# Patient Record
Sex: Female | Born: 1955
Health system: Southern US, Community
[De-identification: ages and names within clinical notes are randomized; demographics above are authoritative.]

## PROBLEM LIST (undated history)

## (undated) DIAGNOSIS — E039 Hypothyroidism, unspecified: Secondary | ICD-10-CM

## (undated) DIAGNOSIS — M791 Myalgia, unspecified site: Secondary | ICD-10-CM

## (undated) HISTORY — PX: ABDOMINAL HYSTERECTOMY: SHX81

## (undated) HISTORY — DX: Hypothyroidism, unspecified: E03.9

## (undated) HISTORY — DX: Myalgia, unspecified site: M79.10

---

## 2017-03-16 DIAGNOSIS — D2262 Melanocytic nevi of left upper limb, including shoulder: Secondary | ICD-10-CM | POA: Diagnosis not present

## 2017-03-16 DIAGNOSIS — D2272 Melanocytic nevi of left lower limb, including hip: Secondary | ICD-10-CM | POA: Diagnosis not present

## 2017-03-16 DIAGNOSIS — D2271 Melanocytic nevi of right lower limb, including hip: Secondary | ICD-10-CM | POA: Diagnosis not present

## 2017-03-16 DIAGNOSIS — L538 Other specified erythematous conditions: Secondary | ICD-10-CM | POA: Diagnosis not present

## 2017-03-16 DIAGNOSIS — L820 Inflamed seborrheic keratosis: Secondary | ICD-10-CM | POA: Diagnosis not present

## 2017-03-16 DIAGNOSIS — D485 Neoplasm of uncertain behavior of skin: Secondary | ICD-10-CM | POA: Diagnosis not present

## 2017-03-16 DIAGNOSIS — D2261 Melanocytic nevi of right upper limb, including shoulder: Secondary | ICD-10-CM | POA: Diagnosis not present

## 2017-05-27 DIAGNOSIS — R5381 Other malaise: Secondary | ICD-10-CM | POA: Diagnosis not present

## 2017-05-27 DIAGNOSIS — N951 Menopausal and female climacteric states: Secondary | ICD-10-CM | POA: Diagnosis not present

## 2017-05-27 DIAGNOSIS — E039 Hypothyroidism, unspecified: Secondary | ICD-10-CM | POA: Diagnosis not present

## 2017-05-27 DIAGNOSIS — G8929 Other chronic pain: Secondary | ICD-10-CM | POA: Diagnosis not present

## 2017-05-27 DIAGNOSIS — D729 Disorder of white blood cells, unspecified: Secondary | ICD-10-CM | POA: Diagnosis not present

## 2017-06-02 DIAGNOSIS — D729 Disorder of white blood cells, unspecified: Secondary | ICD-10-CM | POA: Diagnosis not present

## 2017-06-02 DIAGNOSIS — R5381 Other malaise: Secondary | ICD-10-CM | POA: Diagnosis not present

## 2017-06-02 DIAGNOSIS — E039 Hypothyroidism, unspecified: Secondary | ICD-10-CM | POA: Diagnosis not present

## 2017-06-02 DIAGNOSIS — R5383 Other fatigue: Secondary | ICD-10-CM | POA: Diagnosis not present

## 2017-07-07 DIAGNOSIS — R5383 Other fatigue: Secondary | ICD-10-CM | POA: Diagnosis not present

## 2017-07-07 DIAGNOSIS — G8929 Other chronic pain: Secondary | ICD-10-CM | POA: Diagnosis not present

## 2017-07-07 DIAGNOSIS — D729 Disorder of white blood cells, unspecified: Secondary | ICD-10-CM | POA: Diagnosis not present

## 2017-07-07 DIAGNOSIS — R5381 Other malaise: Secondary | ICD-10-CM | POA: Diagnosis not present

## 2017-07-23 DIAGNOSIS — Z1231 Encounter for screening mammogram for malignant neoplasm of breast: Secondary | ICD-10-CM | POA: Diagnosis not present

## 2017-10-20 DIAGNOSIS — E039 Hypothyroidism, unspecified: Secondary | ICD-10-CM | POA: Diagnosis not present

## 2017-10-20 DIAGNOSIS — R5381 Other malaise: Secondary | ICD-10-CM | POA: Diagnosis not present

## 2017-10-20 DIAGNOSIS — D729 Disorder of white blood cells, unspecified: Secondary | ICD-10-CM | POA: Diagnosis not present

## 2017-10-20 DIAGNOSIS — N951 Menopausal and female climacteric states: Secondary | ICD-10-CM | POA: Diagnosis not present

## 2017-11-03 DIAGNOSIS — R5381 Other malaise: Secondary | ICD-10-CM | POA: Diagnosis not present

## 2017-11-03 DIAGNOSIS — D729 Disorder of white blood cells, unspecified: Secondary | ICD-10-CM | POA: Diagnosis not present

## 2017-11-03 DIAGNOSIS — G8929 Other chronic pain: Secondary | ICD-10-CM | POA: Diagnosis not present

## 2017-11-03 DIAGNOSIS — R5383 Other fatigue: Secondary | ICD-10-CM | POA: Diagnosis not present

## 2017-11-08 DIAGNOSIS — H5203 Hypermetropia, bilateral: Secondary | ICD-10-CM | POA: Diagnosis not present

## 2017-11-11 ENCOUNTER — Ambulatory Visit (INDEPENDENT_AMBULATORY_CARE_PROVIDER_SITE_OTHER): Payer: BLUE CROSS/BLUE SHIELD | Admitting: Sports Medicine

## 2017-11-11 ENCOUNTER — Encounter: Payer: Self-pay | Admitting: Sports Medicine

## 2017-11-11 ENCOUNTER — Ambulatory Visit (INDEPENDENT_AMBULATORY_CARE_PROVIDER_SITE_OTHER): Payer: BLUE CROSS/BLUE SHIELD

## 2017-11-11 DIAGNOSIS — M170 Bilateral primary osteoarthritis of knee: Secondary | ICD-10-CM | POA: Insufficient documentation

## 2017-11-11 DIAGNOSIS — M76821 Posterior tibial tendinitis, right leg: Secondary | ICD-10-CM | POA: Diagnosis not present

## 2017-11-11 DIAGNOSIS — M1712 Unilateral primary osteoarthritis, left knee: Secondary | ICD-10-CM | POA: Diagnosis not present

## 2017-11-11 DIAGNOSIS — M1711 Unilateral primary osteoarthritis, right knee: Secondary | ICD-10-CM

## 2017-11-11 NOTE — Assessment & Plan Note (Addendum)
Failed conservative measures, injection as above. Rehab exercises given, baseline x-rays. Considering her severe valgus deformity and bone-on-bone lateral compartment osteoarthritis I do not think she is far from knee arthroplasty. Return in 1 month.

## 2017-11-11 NOTE — Assessment & Plan Note (Signed)
Rehab exercises given, return for custom orthotics.

## 2017-11-11 NOTE — Progress Notes (Signed)
Subjective:    I'm seeing this patient as a consultation for: Dr. Angelena Sole  CC: Right knee pain  HPI: Nicole Zhang is a pleasant 62 year old female early undergoing treatment for chronic Lyme disease.  For the past several months she had worsening pain in her right knee, as well as a progression of a valgus deformity.  Pain is moderate, persistent, localized at the lateral joint line, significant gelling, no mechanical symptoms.  She has tried oral NSAIDs, analgesics without any improvement in pain.  In addition she has been complaining of pain behind her medial malleolus of her right ankle.  Worse with weightbearing, moderate, persistent without radiation.  I reviewed the past medical history, family history, social history, surgical history, and allergies today and no changes were needed.  Please see the problem list section below in epic for further details.  Past Medical History: Past Medical History:  Diagnosis Date  . Hypothyroid   . Muscle pain    Past Surgical History: Past Surgical History:  Procedure Laterality Date  . ABDOMINAL HYSTERECTOMY     Social History: Social History   Socioeconomic History  . Marital status: Married    Spouse name: Not on file  . Number of children: Not on file  . Years of education: Not on file  . Highest education level: Not on file  Occupational History  . Not on file  Social Needs  . Financial resource strain: Not on file  . Food insecurity:    Worry: Not on file    Inability: Not on file  . Transportation needs:    Medical: Not on file    Non-medical: Not on file  Tobacco Use  . Smoking status: Never Smoker  . Smokeless tobacco: Never Used  Substance and Sexual Activity  . Alcohol use: Never    Frequency: Never  . Drug use: Never  . Sexual activity: Yes  Lifestyle  . Physical activity:    Days per week: Not on file    Minutes per session: Not on file  . Stress: Not on file  Relationships  . Social connections:    Talks on phone: Not on file    Gets together: Not on file    Attends religious service: Not on file    Active member of club or organization: Not on file    Attends meetings of clubs or organizations: Not on file    Relationship status: Not on file  Other Topics Concern  . Not on file  Social History Narrative  . Not on file   Family History: Family History  Problem Relation Age of Onset  . Hypertension Mother   . Diabetes Mother   . Cancer Mother   . Hypertension Father   . Cancer Father    Allergies: Allergies  Allergen Reactions  . Codeine Nausea And Vomiting  . Penicillins Rash   Medications: See med rec.  Review of Systems: No headache, visual changes, nausea, vomiting, diarrhea, constipation, dizziness, abdominal pain, skin rash, fevers, chills, night sweats, weight loss, swollen lymph nodes, body aches, joint swelling, muscle aches, chest pain, shortness of breath, mood changes, visual or auditory hallucinations.   Objective:   General: Well Developed, well nourished, and in no acute distress.  Neuro:  Extra-ocular muscles intact, able to move all 4 extremities, sensation grossly intact.  Deep tendon reflexes tested were normal. Psych: Alert and oriented, mood congruent with affect. ENT:  Ears and nose appear unremarkable.  Hearing grossly normal. Neck: Unremarkable overall appearance,  trachea midline.  No visible thyroid enlargement. Eyes: Conjunctivae and lids appear unremarkable.  Pupils equal and round. Skin: Warm and dry, no rashes noted.  Cardiovascular: Pulses palpable, no extremity edema. Right knee: Valgus deformity, tenderness at the lateral joint line ROM normal in flexion and extension and lower leg rotation. Ligaments with solid consistent endpoints including ACL, PCL, LCL, MCL. Negative Mcmurray's and provocative meniscal tests. Non painful patellar compression. Patellar and quadriceps tendons unremarkable. Hamstring and quadriceps strength is  normal. Right ankle: No visible erythema or swelling. Range of motion is full in all directions. Strength is 5/5 in all directions. Stable lateral and medial ligaments; squeeze test and kleiger test unremarkable; Talar dome nontender; No pain at base of 5th MT; No tenderness over cuboid; No tenderness over N spot or navicular prominence Tender with fullness behind the medial malleolus, reproduction of pain with resisted inversion of the ankle and foot No sign of peroneal tendon subluxations; Negative tarsal tunnel tinel's Able to walk 4 steps.  Procedure: Real-time Ultrasound Guided Injection of right knee joint Device: GE Logiq E  Verbal informed consent obtained.  Time-out conducted.  Noted no overlying erythema, induration, or other signs of local infection.  Skin prepped in a sterile fashion.  Local anesthesia: Topical Ethyl chloride.  With sterile technique and under real time ultrasound guidance: 1 cc kenalog 40, 2 cc lidocaine, 2 cc bupivacaine injected easily. Completed without difficulty  Pain immediately resolved suggesting accurate placement of the medication.  Advised to call if fevers/chills, erythema, induration, drainage, or persistent bleeding.  Images permanently stored and available for review in the ultrasound unit.  Impression: Technically successful ultrasound guided injection.  X-rays personally reviewed and show severe end-stage lateral compartment osteoarthritis in both knees, right worse than left with significant valgus deformity.  Impression and Recommendations:   This case required medical decision making of moderate complexity.  Tibialis posterior tendinitis, right Rehab exercises given, return for custom orthotics.  Primary osteoarthritis of right knee Failed conservative measures, injection as above. Rehab exercises given, baseline x-rays. Considering her severe valgus deformity and bone-on-bone lateral compartment osteoarthritis I do not think  she is far from knee arthroplasty. Return in 1 month. ___________________________________________ Ihor Austin. Benjamin Stain, M.D., ABFM., CAQSM. Primary Care and Sports Medicine [facility]  Adjunct Instructor of Family Medicine  [employer]

## 2017-11-22 ENCOUNTER — Encounter: Payer: Self-pay | Admitting: Sports Medicine

## 2017-11-22 ENCOUNTER — Ambulatory Visit (INDEPENDENT_AMBULATORY_CARE_PROVIDER_SITE_OTHER): Payer: BLUE CROSS/BLUE SHIELD | Admitting: Sports Medicine

## 2017-11-22 DIAGNOSIS — M76821 Posterior tibial tendinitis, right leg: Secondary | ICD-10-CM | POA: Diagnosis not present

## 2017-11-22 DIAGNOSIS — M1711 Unilateral primary osteoarthritis, right knee: Secondary | ICD-10-CM

## 2017-11-22 NOTE — Assessment & Plan Note (Signed)
Custom orthotics as above, continue rehab exercises. If insufficient improvement over the next 4 to 6 weeks we will consider the addition of an [place] brace.

## 2017-11-22 NOTE — Progress Notes (Signed)
    Patient was fitted for a : standard, cushioned, semi-rigid orthotic. The orthotic was heated and afterward the patient stood on the orthotic blank positioned on the orthotic stand. The patient was positioned in subtalar neutral position and 10 degrees of ankle dorsiflexion in a weight bearing stance. After completion of molding, a stable base was applied to the orthotic blank. The blank was ground to a stable position for weight bearing. Size: 9 Base: White EVA Additional Posting and Padding: None The patient ambulated these, and they were very comfortable.  I spent 40 minutes with this patient, greater than 50% was face-to-face time counseling regarding the below diagnosis.  ___________________________________________ Alyviah Crandle J. Cincere Zorn, M.D., ABFM., CAQSM. Primary Care and Sports Medicine [facility]  Adjunct Instructor of Family Medicine  [employer]   

## 2017-11-22 NOTE — Assessment & Plan Note (Signed)
Right knee injection at the last visit provided good relief in pain. There is severe valgus deformity with bone-on-bone lateral compartment osteoarthritis. We could certainly try Visco supplementation if fails to get a 71-month response to the injection.

## 2017-12-20 ENCOUNTER — Encounter: Payer: Self-pay | Admitting: Sports Medicine

## 2017-12-20 ENCOUNTER — Ambulatory Visit: Payer: BLUE CROSS/BLUE SHIELD | Admitting: Sports Medicine

## 2017-12-20 DIAGNOSIS — M76821 Posterior tibial tendinitis, right leg: Secondary | ICD-10-CM | POA: Diagnosis not present

## 2017-12-20 DIAGNOSIS — M170 Bilateral primary osteoarthritis of knee: Secondary | ICD-10-CM

## 2017-12-20 NOTE — Progress Notes (Signed)
Subjective:    CC: Knee pain  HPI: Bilateral knee osteoarthritis: With valgus deformity, fantastic response to right knee injection, 90% pain relief.  She desires a similar procedure on the left today, pain is moderate, persistent, localized to the joint line with gelling.  Tibialis posterior tendinitis: Resolved with custom orthotics and rehab exercises.  I reviewed the past medical history, family history, social history, surgical history, and allergies today and no changes were needed.  Please see the problem list section below in epic for further details.  Past Medical History: Past Medical History:  Diagnosis Date  . Hypothyroid   . Muscle pain    Past Surgical History: Past Surgical History:  Procedure Laterality Date  . ABDOMINAL HYSTERECTOMY     Social History: Social History   Socioeconomic History  . Marital status: Married    Spouse name: Not on file  . Number of children: Not on file  . Years of education: Not on file  . Highest education level: Not on file  Occupational History  . Not on file  Social Needs  . Financial resource strain: Not on file  . Food insecurity:    Worry: Not on file    Inability: Not on file  . Transportation needs:    Medical: Not on file    Non-medical: Not on file  Tobacco Use  . Smoking status: Never Smoker  . Smokeless tobacco: Never Used  Substance and Sexual Activity  . Alcohol use: Never    Frequency: Never  . Drug use: Never  . Sexual activity: Yes  Lifestyle  . Physical activity:    Days per week: Not on file    Minutes per session: Not on file  . Stress: Not on file  Relationships  . Social connections:    Talks on phone: Not on file    Gets together: Not on file    Attends religious service: Not on file    Active member of club or organization: Not on file    Attends meetings of clubs or organizations: Not on file    Relationship status: Not on file  Other Topics Concern  . Not on file  Social History  Narrative  . Not on file   Family History: Family History  Problem Relation Age of Onset  . Hypertension Mother   . Diabetes Mother   . Cancer Mother   . Hypertension Father   . Cancer Father    Allergies: Allergies  Allergen Reactions  . Codeine Nausea And Vomiting  . Penicillins Rash   Medications: See med rec.  Review of Systems: No fevers, chills, night sweats, weight loss, chest pain, or shortness of breath.   Objective:    General: Well Developed, well nourished, and in no acute distress.  Neuro: Alert and oriented x3, extra-ocular muscles intact, sensation grossly intact.  HEENT: Normocephalic, atraumatic, pupils equal round reactive to light, neck supple, no masses, no lymphadenopathy, thyroid nonpalpable.  Skin: Warm and dry, no rashes. Cardiac: Regular rate and rhythm, no murmurs rubs or gallops, no lower extremity edema.  Respiratory: Clear to auscultation bilaterally. Not using accessory muscles, speaking in full sentences. Left knee: Valgus deformity with tenderness at the medial and lateral joint lines. ROM normal in flexion and extension and lower leg rotation. Ligaments with solid consistent endpoints including ACL, PCL, LCL, MCL. Negative Mcmurray's and provocative meniscal tests. Non painful patellar compression. Patellar and quadriceps tendons unremarkable. Hamstring and quadriceps strength is normal.  Procedure: Real-time Ultrasound Guided Injection  of left knee Device: GE Logiq E  Verbal informed consent obtained.  Time-out conducted.  Noted no overlying erythema, induration, or other signs of local infection.  Skin prepped in a sterile fashion.  Local anesthesia: Topical Ethyl chloride.  With sterile technique and under real time ultrasound guidance: 1 cc kenalog 40, 2 cc lidocaine, 2 cc bupivacaine injected easily Completed without difficulty  Pain immediately resolved suggesting accurate placement of the medication.  Advised to call if  fevers/chills, erythema, induration, drainage, or persistent bleeding.  Images permanently stored and available for review in the ultrasound unit.  Impression: Technically successful ultrasound guided injection.  Impression and Recommendations:    Primary osteoarthritis of both knees Excellent relief with right knee injection a month ago. Patient desires left knee to be injected today. This was done under ultrasound guidance, she does have severe valgus deformity with bone-on-bone lateral compartment osteoarthritis.   Tibialis posterior tendinitis, right Resolved with rehabilitation exercises and custom orthotics. We could certainly add an [place] brace for her tibialis posterior tendinitis at a future visit if insufficient improvement. ___________________________________________ Ihor Austin. Benjamin Stain, M.D., ABFM., CAQSM. Primary Care and Sports Medicine [place]  Adjunct Professor of Family Medicine  [employer]

## 2017-12-20 NOTE — Assessment & Plan Note (Signed)
Excellent relief with right knee injection a month ago. Patient desires left knee to be injected today. This was done under ultrasound guidance, she does have severe valgus deformity with bone-on-bone lateral compartment osteoarthritis.

## 2017-12-20 NOTE — Assessment & Plan Note (Signed)
Resolved with rehabilitation exercises and custom orthotics. We could certainly add an [place] brace for her tibialis posterior tendinitis at a future visit if insufficient improvement.

## 2018-02-23 ENCOUNTER — Ambulatory Visit: Payer: BLUE CROSS/BLUE SHIELD | Admitting: Sports Medicine

## 2018-02-23 ENCOUNTER — Encounter: Payer: Self-pay | Admitting: Sports Medicine

## 2018-02-23 DIAGNOSIS — M170 Bilateral primary osteoarthritis of knee: Secondary | ICD-10-CM | POA: Diagnosis not present

## 2018-02-23 NOTE — Progress Notes (Signed)
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  She is now having recurrence of pain, moderate, persistent, localized to the lateral joint line without radiation, no trauma, no mechanical symptoms.  I reviewed the past medical history, family history, social history, surgical history, and allergies today and no changes were needed.  Please see the problem list section below in epic for further details.  Past Medical History: Past Medical History:  Diagnosis Date  . Hypothyroid   . Muscle pain    Past Surgical History: Past Surgical History:  Procedure Laterality Date  . ABDOMINAL HYSTERECTOMY     Social History: Social History   Socioeconomic History  . Marital status: Married    Spouse name: Not on file  . Number of children: Not on file  . Years of education: Not on file  . Highest education level: Not on file  Occupational History  . Not on file  Social Needs  . Financial resource strain: Not on file  . Food insecurity:    Worry: Not on file    Inability: Not on file  . Transportation needs:    Medical: Not on file    Non-medical: Not on file  Tobacco Use  . Smoking status: Never Smoker  . Smokeless tobacco: Never Used  Substance and Sexual Activity  . Alcohol use: Never    Frequency: Never  . Drug use: Never  . Sexual activity: Yes  Lifestyle  . Physical activity:    Days per week: Not on file    Minutes per session: Not on file  . Stress: Not on file  Relationships  . Social connections:    Talks on phone: Not on file    Gets together: Not on file    Attends religious service: Not on file    Active member of club or organization: Not on file    Attends meetings of clubs or organizations: Not on file    Relationship status: Not on file  Other Topics Concern  . Not on file  Social History Narrative  . Not on file    Family History: Family History  Problem Relation Age of Onset  . Hypertension Mother   . Diabetes Mother   . Cancer Mother   . Hypertension Father   . Cancer Father    Allergies: Allergies  Allergen Reactions  . Codeine Nausea And Vomiting  . Penicillins Rash   Medications: See med rec.  Review of Systems: No fevers, chills, night sweats, weight loss, chest pain, or shortness of breath.   Objective:    General: Well Developed, well nourished, and in no acute distress.  Neuro: Alert and oriented x3, extra-ocular muscles intact, sensation grossly intact.  HEENT: Normocephalic, atraumatic, pupils equal round reactive to light, neck supple, no masses, no lymphadenopathy, thyroid nonpalpable.  Skin: Warm and dry, no rashes. Cardiac: Regular rate and rhythm, no murmurs rubs or gallops, no lower extremity edema.  Respiratory: Clear to auscultation bilaterally. Not using accessory muscles, speaking in full sentences. Right knee: Normal to inspection with no erythema or effusion or obvious bony abnormalities. Valgus deformity, tender to palpation at the lateral joint line ROM normal in flexion and extension and lower leg rotation. Ligaments with solid consistent endpoints including ACL, PCL, LCL, MCL. Negative Mcmurray's and provocative meniscal tests. Non painful patellar compression. Patellar and quadriceps tendons unremarkable. Hamstring and  quadriceps strength is normal.  Procedure: Real-time Ultrasound Guided Injection of right knee Device: GE Logiq E  Verbal informed consent obtained.  Time-out conducted.  Noted no overlying erythema, induration, or other signs of local infection.  Skin prepped in a sterile fashion.  Local anesthesia: Topical Ethyl chloride.  With sterile technique and under real time ultrasound guidance: 1 cc Kenalog 40, 2 cc lidocaine, 2 cc bupivacaine injected easily Completed without difficulty  Pain immediately resolved suggesting accurate  placement of the medication.  Advised to call if fevers/chills, erythema, induration, drainage, or persistent bleeding.  Images permanently stored and available for review in the ultrasound unit.  Impression: Technically successful ultrasound guided injection.  Impression and Recommendations:    Primary osteoarthritis of both knees Good response to right knee injection about 3 to 4 months ago. Repeat today. She does have severe valgus deformity with bone-on-bone lateral compartment osteoarthritis and I do think she is approaching arthroplasty. We are going to add an OA reaction brace for unloading of the right lateral compartment. ___________________________________________ Ihor Austin. Benjamin Stain, M.D., ABFM., CAQSM. Primary Care and Sports Medicine [facility]  Adjunct Professor of Family Medicine  [employer]

## 2018-02-23 NOTE — Assessment & Plan Note (Signed)
Good response to right knee injection about 3 to 4 months ago. Repeat today. She does have severe valgus deformity with bone-on-bone lateral compartment osteoarthritis and I do think she is approaching arthroplasty. We are going to add an OA reaction brace for unloading of the right lateral compartment.

## 2018-04-13 DIAGNOSIS — G8929 Other chronic pain: Secondary | ICD-10-CM | POA: Diagnosis not present

## 2018-04-13 DIAGNOSIS — D729 Disorder of white blood cells, unspecified: Secondary | ICD-10-CM | POA: Diagnosis not present

## 2018-04-13 DIAGNOSIS — E039 Hypothyroidism, unspecified: Secondary | ICD-10-CM | POA: Diagnosis not present

## 2018-04-13 DIAGNOSIS — N951 Menopausal and female climacteric states: Secondary | ICD-10-CM | POA: Diagnosis not present

## 2018-04-13 DIAGNOSIS — R5381 Other malaise: Secondary | ICD-10-CM | POA: Diagnosis not present

## 2018-04-27 DIAGNOSIS — E039 Hypothyroidism, unspecified: Secondary | ICD-10-CM | POA: Diagnosis not present

## 2018-04-27 DIAGNOSIS — R5383 Other fatigue: Secondary | ICD-10-CM | POA: Diagnosis not present

## 2018-04-27 DIAGNOSIS — G8929 Other chronic pain: Secondary | ICD-10-CM | POA: Diagnosis not present

## 2018-04-27 DIAGNOSIS — R5381 Other malaise: Secondary | ICD-10-CM | POA: Diagnosis not present

## 2018-06-07 ENCOUNTER — Ambulatory Visit (INDEPENDENT_AMBULATORY_CARE_PROVIDER_SITE_OTHER): Payer: BLUE CROSS/BLUE SHIELD | Admitting: Sports Medicine

## 2018-06-07 ENCOUNTER — Encounter: Payer: Self-pay | Admitting: Sports Medicine

## 2018-06-07 DIAGNOSIS — M170 Bilateral primary osteoarthritis of knee: Secondary | ICD-10-CM | POA: Diagnosis not present

## 2018-06-07 MED ORDER — CELECOXIB 200 MG PO CAPS: ORAL_CAPSULE | ORAL | 2 refills | Status: DC

## 2018-06-07 NOTE — Progress Notes (Signed)
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  Oral Voltaren is insufficiently efficacious at this point.  I reviewed the past medical history, family history, social history, surgical history, and allergies today and no changes were needed.  Please see the problem list section below in epic for further details.  Past Medical History: Past Medical History:  Diagnosis Date  . Hypothyroid   . Muscle pain    Past Surgical History: Past Surgical History:  Procedure Laterality Date  . ABDOMINAL HYSTERECTOMY     Social History: Social History   Socioeconomic History  . Marital status: Married    Spouse name: Not on file  . Number of children: Not on file  . Years of education: Not on file  . Highest education level: Not on file  Occupational History  . Not on file  Social Needs  . Financial resource strain: Not on file  . Food insecurity:    Worry: Not on file    Inability: Not on file  . Transportation needs:    Medical: Not on file    Non-medical: Not on file  Tobacco Use  . Smoking status: Never Smoker  . Smokeless tobacco: Never Used  Substance and Sexual Activity  . Alcohol use: Never    Frequency: Never  . Drug use: Never  . Sexual activity: Yes  Lifestyle  . Physical activity:    Days per week: Not on file    Minutes per session: Not on file  . Stress: Not on file  Relationships  . Social connections:    Talks on phone: Not on file    Gets together: Not on file    Attends religious service: Not on file    Active member of club or organization: Not on file    Attends meetings of clubs or organizations: Not on file    Relationship status: Not on file  Other Topics  Concern  . Not on file  Social History Narrative  . Not on file   Family History: Family History  Problem Relation Age of Onset  . Hypertension Mother   . Diabetes Mother   . Cancer Mother   . Hypertension Father   . Cancer Father    Allergies: Allergies  Allergen Reactions  . Codeine Nausea And Vomiting  . Penicillins Rash   Medications: See med rec.  Review of Systems: No fevers, chills, night sweats, weight loss, chest pain, or shortness of breath.   Objective:    General: Well Developed, well nourished, and in no acute distress.  Neuro: Alert and oriented x3, extra-ocular muscles intact, sensation grossly intact.  HEENT: Normocephalic, atraumatic, pupils equal round reactive to light, neck supple, no masses, no lymphadenopathy, thyroid nonpalpable.  Skin: Warm and dry, no rashes. Cardiac: Regular rate and rhythm, no murmurs rubs or gallops, no lower extremity edema.  Respiratory: Clear to auscultation bilaterally. Not using accessory muscles, speaking in full sentences. Bilateral knees: Mild swelling, valgus deformity, tender at the lateral joint lines. ROM normal in flexion and extension and lower leg rotation. Ligaments with solid consistent endpoints including ACL, PCL, LCL, MCL. Negative Mcmurray's and provocative meniscal tests. Non painful patellar compression. Patellar and quadriceps tendons unremarkable. Hamstring and quadriceps strength is normal.  Procedure: Real-time Ultrasound Guided injection of the left knee Device: GE Logiq E  Verbal informed consent obtained.  Time-out conducted.  Noted no overlying erythema, induration, or other signs of local infection.  Skin prepped in a sterile fashion.  Local anesthesia: Topical Ethyl chloride.  With sterile technique and under real time ultrasound guidance:  1 cc Kenalog 40, 2 cc lidocaine, 2 cc bupivacaine injected easily Completed without difficulty  Pain immediately resolved suggesting accurate placement  of the medication.  Advised to call if fevers/chills, erythema, induration, drainage, or persistent bleeding.  Images permanently stored and available for review in the ultrasound unit.  Impression: Technically successful ultrasound guided injection.  Procedure: Real-time Ultrasound Guided injection of the right knee Device: GE Logiq E  Verbal informed consent obtained.  Time-out conducted.  Noted no overlying erythema, induration, or other signs of local infection.  Skin prepped in a sterile fashion.  Local anesthesia: Topical Ethyl chloride.  With sterile technique and under real time ultrasound guidance:  1 cc Kenalog 40, 2 cc lidocaine, 2 cc bupivacaine injected easily Completed without difficulty  Pain immediately resolved suggesting accurate placement of the medication.  Advised to call if fevers/chills, erythema, induration, drainage, or persistent bleeding.  Images permanently stored and available for review in the ultrasound unit.  Impression: Technically successful ultrasound guided injection.  Impression and Recommendations:    Primary osteoarthritis of both knees Previous injection was on the right in January of this year. Repeat injections, this time bilaterally. She does have a severe valgus deformity with bone-on-bone lateral compartment osteoarthritis, approaching arthroplasty. Switching from Voltaren to Celebrex. Return as needed.   ___________________________________________ Ihor Austin. Benjamin Stain, M.D., ABFM., CAQSM. Primary Care and Sports Medicine [place] [facility]  Adjunct Professor of Family Medicine  [employer]

## 2018-06-07 NOTE — Assessment & Plan Note (Signed)
Previous injection was on the right in January of this year. Repeat injections, this time bilaterally. She does have a severe valgus deformity with bone-on-bone lateral compartment osteoarthritis, approaching arthroplasty. Switching from Voltaren to Celebrex. Return as needed.

## 2018-07-05 DIAGNOSIS — D225 Melanocytic nevi of trunk: Secondary | ICD-10-CM | POA: Diagnosis not present

## 2018-07-05 DIAGNOSIS — L648 Other androgenic alopecia: Secondary | ICD-10-CM | POA: Diagnosis not present

## 2018-07-05 DIAGNOSIS — L821 Other seborrheic keratosis: Secondary | ICD-10-CM | POA: Diagnosis not present

## 2018-07-05 DIAGNOSIS — D2271 Melanocytic nevi of right lower limb, including hip: Secondary | ICD-10-CM | POA: Diagnosis not present

## 2018-07-19 DIAGNOSIS — N951 Menopausal and female climacteric states: Secondary | ICD-10-CM | POA: Diagnosis not present

## 2018-07-19 DIAGNOSIS — E039 Hypothyroidism, unspecified: Secondary | ICD-10-CM | POA: Diagnosis not present

## 2018-08-02 DIAGNOSIS — G8929 Other chronic pain: Secondary | ICD-10-CM | POA: Diagnosis not present

## 2018-08-02 DIAGNOSIS — R5381 Other malaise: Secondary | ICD-10-CM | POA: Diagnosis not present

## 2018-08-02 DIAGNOSIS — E669 Obesity, unspecified: Secondary | ICD-10-CM | POA: Diagnosis not present

## 2018-08-02 DIAGNOSIS — E039 Hypothyroidism, unspecified: Secondary | ICD-10-CM | POA: Diagnosis not present

## 2018-08-02 DIAGNOSIS — R05 Cough: Secondary | ICD-10-CM | POA: Diagnosis not present

## 2018-08-02 DIAGNOSIS — E009 Congenital iodine-deficiency syndrome, unspecified: Secondary | ICD-10-CM | POA: Diagnosis not present

## 2018-08-02 DIAGNOSIS — R5383 Other fatigue: Secondary | ICD-10-CM | POA: Diagnosis not present

## 2018-09-05 ENCOUNTER — Other Ambulatory Visit: Payer: Self-pay | Admitting: Sports Medicine

## 2018-09-05 DIAGNOSIS — M170 Bilateral primary osteoarthritis of knee: Secondary | ICD-10-CM

## 2018-09-22 ENCOUNTER — Other Ambulatory Visit: Payer: Self-pay

## 2018-09-22 ENCOUNTER — Ambulatory Visit: Payer: BC Managed Care – PPO | Admitting: Sports Medicine

## 2018-09-22 ENCOUNTER — Encounter: Payer: Self-pay | Admitting: Sports Medicine

## 2018-09-22 DIAGNOSIS — M170 Bilateral primary osteoarthritis of knee: Secondary | ICD-10-CM

## 2018-09-22 NOTE — Assessment & Plan Note (Signed)
Right knee injection, she is heading to [facility]. Return as needed.

## 2018-09-22 NOTE — Progress Notes (Signed)
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  She is going to the beach and desires repeat interventional treatment today.  I reviewed the past medical history, family history, social history, surgical history, and allergies today and no changes were needed.  Please see the problem list section below in epic for further details.  Past Medical History: Past Medical History:  Diagnosis Date  . Hypothyroid   . Muscle pain    Past Surgical History: Past Surgical History:  Procedure Laterality Date  . ABDOMINAL HYSTERECTOMY     Social History: Social History   Socioeconomic History  . Marital status: Married    Spouse name: Not on file  . Number of children: Not on file  . Years of education: Not on file  . Highest education level: Not on file  Occupational History  . Not on file  Social Needs  . Financial resource strain: Not on file  . Food insecurity    Worry: Not on file    Inability: Not on file  . Transportation needs    Medical: Not on file    Non-medical: Not on file  Tobacco Use  . Smoking status: Never Smoker  . Smokeless tobacco: Never Used  Substance and Sexual Activity  . Alcohol use: Never    Frequency: Never  . Drug use: Never  . Sexual activity: Yes  Lifestyle  . Physical activity    Days per week: Not on file    Minutes per session: Not on file  . Stress: Not on file  Relationships  . Social Musicianconnections    Talks on phone: Not on file    Gets together: Not on file    Attends religious service: Not on file    Active member of club or organization: Not on file    Attends meetings of clubs or organizations: Not on file    Relationship status: Not on file  Other Topics Concern  . Not on file  Social History  Narrative  . Not on file   Family History: Family History  Problem Relation Age of Onset  . Hypertension Mother   . Diabetes Mother   . Cancer Mother   . Hypertension Father   . Cancer Father    Allergies: Allergies  Allergen Reactions  . Codeine Nausea And Vomiting  . Penicillins Rash   Medications: See med rec.  Review of Systems: No fevers, chills, night sweats, weight loss, chest pain, or shortness of breath.   Objective:    General: Well Developed, well nourished, and in no acute distress.  Neuro: Alert and oriented x3, extra-ocular muscles intact, sensation grossly intact.  HEENT: Normocephalic, atraumatic, pupils equal round reactive to light, neck supple, no masses, no lymphadenopathy, thyroid nonpalpable.  Skin: Warm and dry, no rashes. Cardiac: Regular rate and rhythm, no murmurs rubs or gallops, no lower extremity edema.  Respiratory: Clear to auscultation bilaterally. Not using accessory muscles, speaking in full sentences. Knees: Right and left knees have a valgus deformity, tenderness at the medial and lateral joint lines.  Procedure: Real-time Ultrasound Guided injection of the right knee Device: GE Logiq E  Verbal informed consent obtained.  Time-out conducted.  Noted no overlying erythema, induration, or other signs of  local infection.  Skin prepped in a sterile fashion.  Local anesthesia: Topical Ethyl chloride.  With sterile technique and under real time ultrasound guidance:  1 cc Kenalog 40, 2 cc lidocaine, 2 cc bupivacaine injected easily Completed without difficulty  Pain immediately resolved suggesting accurate placement of the medication.  Advised to call if fevers/chills, erythema, induration, drainage, or persistent bleeding.  Images permanently stored and available for review in the ultrasound unit.  Impression: Technically successful ultrasound guided injection.  Impression and Recommendations:    Primary osteoarthritis of both knees Right  knee injection, she is heading to [facility]. Return as needed.   ___________________________________________ Gwen Her. Dianah Field, M.D., ABFM., CAQSM. Primary Care and Sports Medicine [facility]  Adjunct Professor of [employer] [facility] of Medicine

## 2018-10-06 DIAGNOSIS — Z1231 Encounter for screening mammogram for malignant neoplasm of breast: Secondary | ICD-10-CM | POA: Diagnosis not present

## 2018-12-05 ENCOUNTER — Other Ambulatory Visit: Payer: Self-pay | Admitting: Sports Medicine

## 2018-12-05 DIAGNOSIS — M170 Bilateral primary osteoarthritis of knee: Secondary | ICD-10-CM

## 2018-12-21 ENCOUNTER — Ambulatory Visit: Payer: BC Managed Care – PPO | Admitting: Sports Medicine

## 2018-12-21 ENCOUNTER — Encounter: Payer: Self-pay | Admitting: Sports Medicine

## 2018-12-21 ENCOUNTER — Other Ambulatory Visit: Payer: Self-pay

## 2018-12-21 DIAGNOSIS — M170 Bilateral primary osteoarthritis of knee: Secondary | ICD-10-CM | POA: Diagnosis not present

## 2018-12-21 NOTE — Assessment & Plan Note (Signed)
Bilateral knee injection, getting her approved for viscosupplementation.

## 2018-12-21 NOTE — Progress Notes (Signed)
Subjective:    CC: Knee pain  HPI: Currents of knee pain, right knee was injected approximately 3 months ago, left knee about 7 months ago.  Now with recurrence of pain, moderate, persistent, localized without radiation, no trauma, no mechanical symptoms.  I reviewed the past medical history, family history, social history, surgical history, and allergies today and no changes were needed.  Please see the problem list section below in epic for further details.  Past Medical History: Past Medical History:  Diagnosis Date  . Hypothyroid   . Muscle pain    Past Surgical History: Past Surgical History:  Procedure Laterality Date  . ABDOMINAL HYSTERECTOMY     Social History: Social History   Socioeconomic History  . Marital status: Married    Spouse name: Not on file  . Number of children: Not on file  . Years of education: Not on file  . Highest education level: Not on file  Occupational History  . Not on file  Social Needs  . Financial resource strain: Not on file  . Food insecurity    Worry: Not on file    Inability: Not on file  . Transportation needs    Medical: Not on file    Non-medical: Not on file  Tobacco Use  . Smoking status: Never Smoker  . Smokeless tobacco: Never Used  Substance and Sexual Activity  . Alcohol use: Never    Frequency: Never  . Drug use: Never  . Sexual activity: Yes  Lifestyle  . Physical activity    Days per week: Not on file    Minutes per session: Not on file  . Stress: Not on file  Relationships  . Social Musician on phone: Not on file    Gets together: Not on file    Attends religious service: Not on file    Active member of club or organization: Not on file    Attends meetings of clubs or organizations: Not on file    Relationship status: Not on file  Other Topics Concern  . Not on file  Social History Narrative  . Not on file   Family History: Family History  Problem Relation Age of Onset  . Hypertension  Mother   . Diabetes Mother   . Cancer Mother   . Hypertension Father   . Cancer Father    Allergies: Allergies  Allergen Reactions  . Codeine Nausea And Vomiting  . Penicillins Rash   Medications: See med rec.  Review of Systems: No fevers, chills, night sweats, weight loss, chest pain, or shortness of breath.   Objective:    General: Well Developed, well nourished, and in no acute distress.  Neuro: Alert and oriented x3, extra-ocular muscles intact, sensation grossly intact.  HEENT: Normocephalic, atraumatic, pupils equal round reactive to light, neck supple, no masses, no lymphadenopathy, thyroid nonpalpable.  Skin: Warm and dry, no rashes. Cardiac: Regular rate and rhythm, no murmurs rubs or gallops, no lower extremity edema.  Respiratory: Clear to auscultation bilaterally. Not using accessory muscles, speaking in full sentences. Bilateral knees: Valgus deformity, tender at the joint lines ROM normal in flexion and extension and lower leg rotation. Ligaments with solid consistent endpoints including ACL, PCL, LCL, MCL. Negative Mcmurray's and provocative meniscal tests. Non painful patellar compression. Patellar and quadriceps tendons unremarkable. Hamstring and quadriceps strength is normal.  Procedure: Real-time Ultrasound Guided injection of the left knee Device: GE Logiq E  Verbal informed consent obtained.  Time-out conducted.  Noted no overlying erythema, induration, or other signs of local infection.  Skin prepped in a sterile fashion.  Local anesthesia: Topical Ethyl chloride.  With sterile technique and under real time ultrasound guidance: 1 cc Kenalog 40, 2 cc lidocaine, 2 cc bupivacaine injected easily Completed without difficulty  Pain immediately resolved suggesting accurate placement of the medication.  Advised to call if fevers/chills, erythema, induration, drainage, or persistent bleeding.  Images permanently stored and available for review in the  ultrasound unit.  Impression: Technically successful ultrasound guided injection.  Procedure: Real-time Ultrasound Guided injection of the right knee Device: GE Logiq E  Verbal informed consent obtained.  Time-out conducted.  Noted no overlying erythema, induration, or other signs of local infection.  Skin prepped in a sterile fashion.  Local anesthesia: Topical Ethyl chloride.  With sterile technique and under real time ultrasound guidance: 1 cc Kenalog 40, 2 cc lidocaine, 2 cc bupivacaine injected easily Completed without difficulty  Pain immediately resolved suggesting accurate placement of the medication.  Advised to call if fevers/chills, erythema, induration, drainage, or persistent bleeding.  Images permanently stored and available for review in the ultrasound unit.  Impression: Technically successful ultrasound guided injection.  Impression and Recommendations:    Primary osteoarthritis of both knees Bilateral knee injection, getting her approved for viscosupplementation.   ___________________________________________ Gwen Her. Dianah Field, M.D., ABFM., CAQSM. Primary Care and Sports Medicine [facility]  Adjunct Professor of [employer] [facility] of [employer]

## 2019-01-09 ENCOUNTER — Other Ambulatory Visit: Payer: Self-pay | Admitting: Sports Medicine

## 2019-01-09 DIAGNOSIS — M170 Bilateral primary osteoarthritis of knee: Secondary | ICD-10-CM

## 2019-01-11 ENCOUNTER — Telehealth: Payer: Self-pay | Admitting: Sports Medicine

## 2019-01-11 DIAGNOSIS — E559 Vitamin D deficiency, unspecified: Secondary | ICD-10-CM | POA: Diagnosis not present

## 2019-01-11 DIAGNOSIS — R7982 Elevated C-reactive protein (CRP): Secondary | ICD-10-CM | POA: Diagnosis not present

## 2019-01-11 DIAGNOSIS — E039 Hypothyroidism, unspecified: Secondary | ICD-10-CM | POA: Diagnosis not present

## 2019-01-11 DIAGNOSIS — D729 Disorder of white blood cells, unspecified: Secondary | ICD-10-CM | POA: Diagnosis not present

## 2019-01-11 NOTE — Telephone Encounter (Signed)
-----   Message from Tasia Catchings, [place] sent at 12/26/2018 12:00 PM EST ----- I am waiting on the provider to sign the form and fax to [facility].  ----- Message ----- From: Silverio Decamp, MD Sent: 12/21/2018   1:43 PM EST To: Tasia Catchings, CMA  [facility] approval please, bilateral. ___________________________________________Thomas Lenna Sciara. Dianah Field, M.D., ABFM., CAQSM.[facility] and [facility] MedicineC[facility] KernersvilleAdjunct Professor of [place] [employer] [facility] of Medicine

## 2019-01-11 NOTE — Telephone Encounter (Signed)
I received a fax from [place] that patient will have an estimated cost of 10 percent of the cost. I have left a message for the patient to call back.

## 2019-01-13 NOTE — Telephone Encounter (Signed)
I called the patient and she is agreeable to thee estimated cost of the injections. She is only wanting to do the right knee at this time. No other questions. 1st knee injection scheduled for next week.

## 2019-01-16 DIAGNOSIS — H1045 Other chronic allergic conjunctivitis: Secondary | ICD-10-CM | POA: Diagnosis not present

## 2019-01-16 DIAGNOSIS — H5203 Hypermetropia, bilateral: Secondary | ICD-10-CM | POA: Diagnosis not present

## 2019-01-16 DIAGNOSIS — H2513 Age-related nuclear cataract, bilateral: Secondary | ICD-10-CM | POA: Diagnosis not present

## 2019-01-16 DIAGNOSIS — H524 Presbyopia: Secondary | ICD-10-CM | POA: Diagnosis not present

## 2019-01-17 ENCOUNTER — Ambulatory Visit (INDEPENDENT_AMBULATORY_CARE_PROVIDER_SITE_OTHER): Payer: BC Managed Care – PPO | Admitting: Sports Medicine

## 2019-01-17 ENCOUNTER — Other Ambulatory Visit: Payer: Self-pay

## 2019-01-17 DIAGNOSIS — M170 Bilateral primary osteoarthritis of knee: Secondary | ICD-10-CM | POA: Diagnosis not present

## 2019-01-17 NOTE — Progress Notes (Signed)
   Procedure: Real-time Ultrasound Guided injection of the right knee Device: Samsung HS60  Verbal informed consent obtained.  Time-out conducted.  Noted no overlying erythema, induration, or other signs of local infection.  Skin prepped in a sterile fashion.  Local anesthesia: Topical Ethyl chloride.  With sterile technique and under real time ultrasound guidance:  30 mg/2 mL of OrthoVisc (sodium hyaluronate) in a prefilled syringe was injected easily into the knee through a 22-gauge needle. Completed without difficulty  Pain immediately resolved suggesting accurate placement of the medication.  Advised to call if fevers/chills, erythema, induration, drainage, or persistent bleeding.  Images permanently stored and available for review in the ultrasound unit.  Impression: Technically successful ultrasound guided injection.  ___________________________________________________________________________  Primary osteoarthritis of both knees Left knee doing okay, right knee [facility] No. 1 of 4 today, return in 1 week for #2 of 4 right knee.

## 2019-01-17 NOTE — Assessment & Plan Note (Signed)
Left knee doing okay, right knee [facility] No. 1 of 4 today, return in 1 week for #2 of 4 right knee.

## 2019-01-24 ENCOUNTER — Ambulatory Visit: Payer: BC Managed Care – PPO | Admitting: Sports Medicine

## 2019-01-24 ENCOUNTER — Other Ambulatory Visit: Payer: Self-pay

## 2019-01-24 DIAGNOSIS — M170 Bilateral primary osteoarthritis of knee: Secondary | ICD-10-CM | POA: Diagnosis not present

## 2019-01-24 NOTE — Assessment & Plan Note (Signed)
Left knee continues to be okay, Orthovisc No. 2 of 4 into the right knee, return in 1 week for #3 of 4.

## 2019-01-24 NOTE — Progress Notes (Signed)
   Procedure: Real-time Ultrasound Guided injection of the right knee Device: Samsung HS60  Verbal informed consent obtained.  Time-out conducted.  Noted no overlying erythema, induration, or other signs of local infection.  Skin prepped in a sterile fashion.  Local anesthesia: Topical Ethyl chloride.  With sterile technique and under real time ultrasound guidance:  30 mg/2 mL of OrthoVisc (sodium hyaluronate) in a prefilled syringe was injected easily into the knee through a 22-gauge needle. Completed without difficulty  Pain immediately resolved suggesting accurate placement of the medication.  Advised to call if fevers/chills, erythema, induration, drainage, or persistent bleeding.  Images permanently stored and available for review in the ultrasound unit.  Impression: Technically successful ultrasound guided injection. 

## 2019-01-25 DIAGNOSIS — R5383 Other fatigue: Secondary | ICD-10-CM | POA: Diagnosis not present

## 2019-01-25 DIAGNOSIS — G8929 Other chronic pain: Secondary | ICD-10-CM | POA: Diagnosis not present

## 2019-01-25 DIAGNOSIS — E669 Obesity, unspecified: Secondary | ICD-10-CM | POA: Diagnosis not present

## 2019-01-25 DIAGNOSIS — R5381 Other malaise: Secondary | ICD-10-CM | POA: Diagnosis not present

## 2019-01-25 DIAGNOSIS — E039 Hypothyroidism, unspecified: Secondary | ICD-10-CM | POA: Diagnosis not present

## 2019-01-31 ENCOUNTER — Other Ambulatory Visit: Payer: Self-pay

## 2019-01-31 ENCOUNTER — Ambulatory Visit: Payer: BC Managed Care – PPO | Admitting: Sports Medicine

## 2019-01-31 DIAGNOSIS — M170 Bilateral primary osteoarthritis of knee: Secondary | ICD-10-CM | POA: Diagnosis not present

## 2019-01-31 NOTE — Assessment & Plan Note (Signed)
Left knee continues to do okay, Orthovisc No. 3 of 4 into the right knee, return in 1 week for #4 of 4.

## 2019-01-31 NOTE — Progress Notes (Signed)
   Procedure: Real-time Ultrasound Guided injection of the right knee Device: Samsung HS60  Verbal informed consent obtained.  Time-out conducted.  Noted no overlying erythema, induration, or other signs of local infection.  Skin prepped in a sterile fashion.  Local anesthesia: Topical Ethyl chloride.  With sterile technique and under real time ultrasound guidance:  30 mg/2 mL of OrthoVisc (sodium hyaluronate) in a prefilled syringe was injected easily into the knee through a 22-gauge needle. Completed without difficulty  Pain immediately resolved suggesting accurate placement of the medication.  Advised to call if fevers/chills, erythema, induration, drainage, or persistent bleeding.  Images permanently stored and available for review in the ultrasound unit.  Impression: Technically successful ultrasound guided injection. 

## 2019-02-07 ENCOUNTER — Other Ambulatory Visit: Payer: Self-pay | Admitting: Sports Medicine

## 2019-02-07 ENCOUNTER — Other Ambulatory Visit: Payer: Self-pay

## 2019-02-07 ENCOUNTER — Ambulatory Visit: Payer: BC Managed Care – PPO | Admitting: Sports Medicine

## 2019-02-07 DIAGNOSIS — M170 Bilateral primary osteoarthritis of knee: Secondary | ICD-10-CM | POA: Diagnosis not present

## 2019-02-07 NOTE — Progress Notes (Signed)
   Procedure: Real-time Ultrasound Guided injection of the right knee Device: Samsung HS60  Verbal informed consent obtained.  Time-out conducted.  Noted no overlying erythema, induration, or other signs of local infection.  Skin prepped in a sterile fashion.  Local anesthesia: Topical Ethyl chloride.  With sterile technique and under real time ultrasound guidance:  30 mg/2 mL of OrthoVisc (sodium hyaluronate) in a prefilled syringe was injected easily into the knee through a 22-gauge needle. Completed without difficulty  Pain immediately resolved suggesting accurate placement of the medication.  Advised to call if fevers/chills, erythema, induration, drainage, or persistent bleeding.  Images permanently stored and available for review in the ultrasound unit.  Impression: Technically successful ultrasound guided injection. 

## 2019-02-07 NOTE — Assessment & Plan Note (Signed)
Orthovisc No. 4 of 4 to the right knee, return as needed.

## 2019-03-06 ENCOUNTER — Other Ambulatory Visit: Payer: Self-pay | Admitting: Sports Medicine

## 2019-03-06 DIAGNOSIS — M170 Bilateral primary osteoarthritis of knee: Secondary | ICD-10-CM

## 2019-03-29 DIAGNOSIS — R5383 Other fatigue: Secondary | ICD-10-CM | POA: Diagnosis not present

## 2019-03-29 DIAGNOSIS — G8929 Other chronic pain: Secondary | ICD-10-CM | POA: Diagnosis not present

## 2019-03-29 DIAGNOSIS — R5381 Other malaise: Secondary | ICD-10-CM | POA: Diagnosis not present

## 2019-03-29 DIAGNOSIS — E039 Hypothyroidism, unspecified: Secondary | ICD-10-CM | POA: Diagnosis not present

## 2019-04-05 NOTE — Telephone Encounter (Signed)
Pt sent a MyChart msg requesting advice.

## 2019-04-08 ENCOUNTER — Other Ambulatory Visit: Payer: Self-pay | Admitting: Sports Medicine

## 2019-04-08 DIAGNOSIS — M170 Bilateral primary osteoarthritis of knee: Secondary | ICD-10-CM

## 2019-06-08 ENCOUNTER — Other Ambulatory Visit: Payer: Self-pay | Admitting: Sports Medicine

## 2019-06-08 DIAGNOSIS — M170 Bilateral primary osteoarthritis of knee: Secondary | ICD-10-CM

## 2019-06-22 IMAGING — DX DG KNEE COMPLETE 4+V*R*
2 series · 2 of 2 positions shown · non-contrast
Comparison: None.

CLINICAL DATA: Bilateral knee pain for greater than 3 years

EXAM:
LEFT KNEE - COMPLETE 4+ VIEW; RIGHT KNEE - COMPLETE 4+ VIEW

[knee lat]
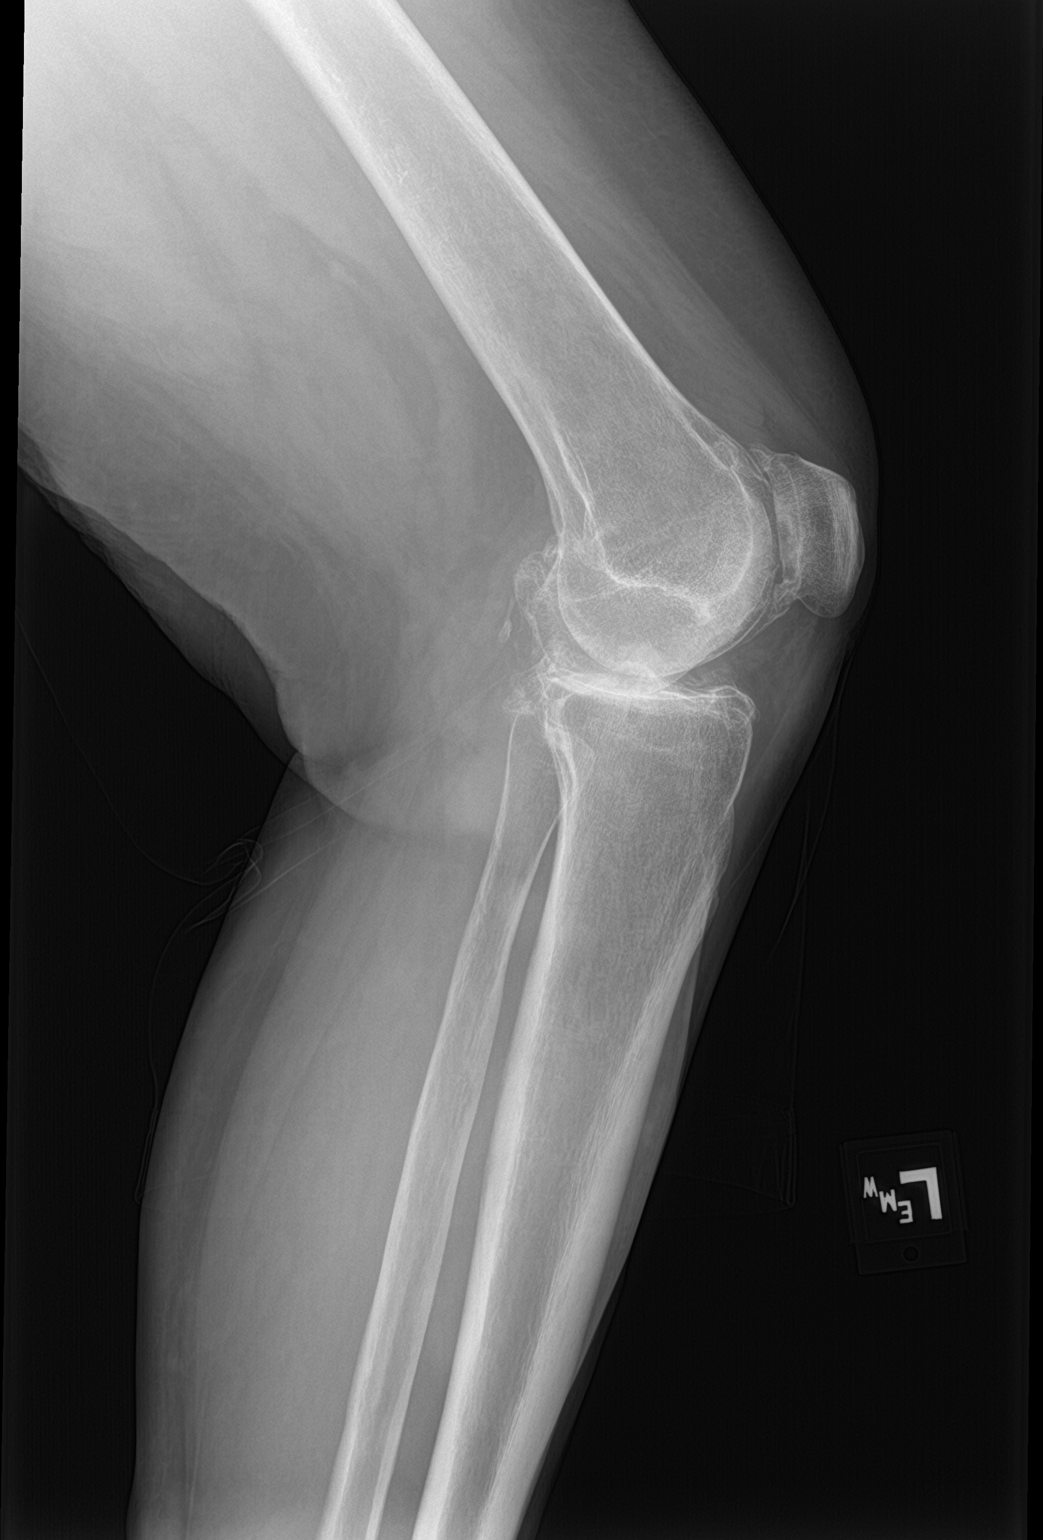

[knee sunrise]
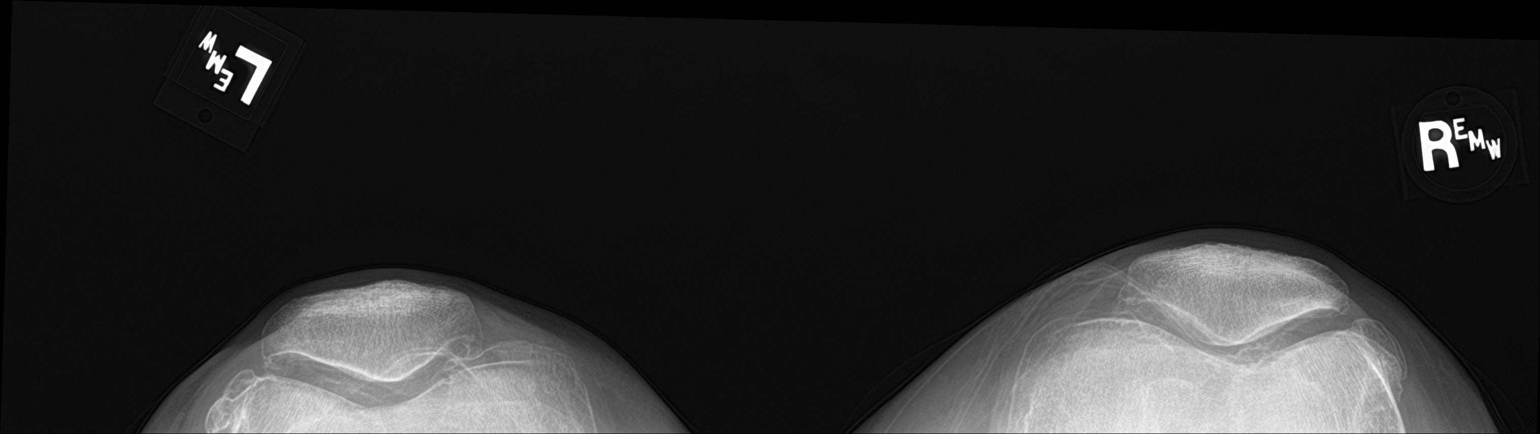

[2 of 2 positions shown; findings below may reference images not displayed]

FINDINGS: Right knee: Mild genu valgum. No fracture. Severe arthritis of the
patellofemoral compartment with joint space narrowing and prominent
osteophyte. Severe arthritis of the lateral compartment with severe
joint space narrowing, sclerosis and osteophyte. Mild degenerative
change of the medial compartment. No significant knee effusion.

Left knee: Mild genu valgum. No fracture. Severe degenerative change
lateral compartment with narrowing and osteophyte. Mild subchondral
sclerosis. Mild medial degenerative change. Marked patellofemoral
degenerative change. No significant knee effusion.
IMPRESSION: Mild genu valgum. Tricompartment arthritis of both knees most severe
involving the patellofemoral and lateral joint space compartments.

## 2019-07-07 ENCOUNTER — Other Ambulatory Visit: Payer: Self-pay | Admitting: Sports Medicine

## 2019-07-07 DIAGNOSIS — M170 Bilateral primary osteoarthritis of knee: Secondary | ICD-10-CM

## 2019-08-01 ENCOUNTER — Other Ambulatory Visit: Payer: Self-pay | Admitting: Sports Medicine

## 2019-08-01 DIAGNOSIS — M170 Bilateral primary osteoarthritis of knee: Secondary | ICD-10-CM

## 2019-09-05 ENCOUNTER — Other Ambulatory Visit: Payer: Self-pay | Admitting: Sports Medicine

## 2019-09-05 DIAGNOSIS — M170 Bilateral primary osteoarthritis of knee: Secondary | ICD-10-CM

## 2019-10-10 ENCOUNTER — Other Ambulatory Visit: Payer: Self-pay | Admitting: Sports Medicine

## 2019-10-10 DIAGNOSIS — M170 Bilateral primary osteoarthritis of knee: Secondary | ICD-10-CM

## 2019-12-04 ENCOUNTER — Other Ambulatory Visit: Payer: Self-pay | Admitting: Sports Medicine

## 2019-12-04 DIAGNOSIS — M170 Bilateral primary osteoarthritis of knee: Secondary | ICD-10-CM
